# Patient Record
Sex: Female | Born: 1970 | Race: White | Hispanic: No | Smoking: Current every day smoker
Health system: Southern US, Community
[De-identification: ages and names within clinical notes are randomized; demographics above are authoritative.]

---

## 2016-06-26 ENCOUNTER — Emergency Department (HOSPITAL_COMMUNITY)
Admission: EM | Admit: 2016-06-26 | Discharge: 2016-06-26 | Disposition: A | Discharge status: Home / Self Care | Payer: Self-pay | Attending: Emergency Medicine | Admitting: Emergency Medicine

## 2016-06-26 ENCOUNTER — Encounter (HOSPITAL_COMMUNITY): Payer: Self-pay

## 2016-06-26 ENCOUNTER — Emergency Department (HOSPITAL_COMMUNITY): Payer: Self-pay

## 2016-06-26 DIAGNOSIS — R079 Chest pain, unspecified: Secondary | ICD-10-CM | POA: Insufficient documentation

## 2016-06-26 DIAGNOSIS — Z5321 Procedure and treatment not carried out due to patient leaving prior to being seen by health care provider: Secondary | ICD-10-CM | POA: Insufficient documentation

## 2016-06-26 LAB — BASIC METABOLIC PANEL
Anion gap: 8 (ref 5–15)
BUN: 7 mg/dL (ref 6–20)
CO2: 25 mmol/L (ref 22–32)
CREATININE: 0.79 mg/dL (ref 0.44–1.00)
Calcium: 9 mg/dL (ref 8.9–10.3)
Chloride: 105 mmol/L (ref 101–111)
GFR calc Af Amer: >60 mL/min (ref >60)
GLUCOSE: 111 mg/dL — AB (ref 65–99)
POTASSIUM: 3.6 mmol/L (ref 3.5–5.1)
SODIUM: 138 mmol/L (ref 135–145)

## 2016-06-26 LAB — CBC
HEMATOCRIT: 25.7 % — AB (ref 36.0–46.0)
Hemoglobin: 7 g/dL — ABNORMAL LOW (ref 12.0–15.0)
MCH: 17.9 pg — ABNORMAL LOW (ref 26.0–34.0)
MCHC: 27.2 g/dL — AB (ref 30.0–36.0)
MCV: 65.7 fL — AB (ref 78.0–100.0)
PLATELETS: 210 10*3/uL (ref 150–400)
RBC: 3.91 MIL/uL (ref 3.87–5.11)
RDW: 17.1 % — AB (ref 11.5–15.5)
WBC: 9.8 10*3/uL (ref 4.0–10.5)

## 2016-06-26 LAB — I-STAT TROPONIN, ED: Troponin i, poc: 0 ng/mL (ref 0.00–0.08)

## 2016-06-26 NOTE — ED Triage Notes (Signed)
Pt complaining of L sided chest pain and burning. Pt states new onset this evening. Pt also complaining of some SOB. Pt denies any cough. Pt states pain radiates to L jaw.

## 2016-06-26 NOTE — ED Triage Notes (Signed)
Pt. added itchy localized skin rashes at right forearm for several days .

## 2016-06-26 NOTE — ED Notes (Signed)
Warm blankets handed out and delay explanation provided to all patients in lobby.   

## 2016-06-26 NOTE — ED Notes (Signed)
Patient came up to RN at [facility] and stated "I feel sick and terrible so I am just going to go home".  This RN stated "that is exactly why you should stay.  Can I do anything?".  Pt expressed frustrations with wait time.  This RN reviewed patients chart and strongly encouraged patient not to leave.  Patient stated "there must not be anything wrong with me if you would let me sit out here this long".  This RN attempted to explain that wait times may still happen even if a patient is considered "sick" and that she continued to recommend patient stay due to labs.  Patient refused.  This RN asked if patient would wait for RN to get MD to review labs and POC with patient before getting her to a room, patient refused and left the ED.

## 2016-06-26 NOTE — ED Notes (Signed)
Pt also has a rash on her right arm with redness and itching.

## 2016-08-28 DIAGNOSIS — R632 Polyphagia: Secondary | ICD-10-CM | POA: Diagnosis not present

## 2016-09-10 DIAGNOSIS — D508 Other iron deficiency anemias: Secondary | ICD-10-CM | POA: Diagnosis not present

## 2016-09-10 DIAGNOSIS — N925 Other specified irregular menstruation: Secondary | ICD-10-CM | POA: Diagnosis not present

## 2016-09-10 DIAGNOSIS — Z01419 Encounter for gynecological examination (general) (routine) without abnormal findings: Secondary | ICD-10-CM | POA: Diagnosis not present

## 2016-09-10 DIAGNOSIS — Z0000 Encounter for general adult medical examination without abnormal findings: Secondary | ICD-10-CM | POA: Diagnosis not present

## 2016-09-25 DIAGNOSIS — D508 Other iron deficiency anemias: Secondary | ICD-10-CM | POA: Diagnosis not present

## 2016-12-09 DIAGNOSIS — D508 Other iron deficiency anemias: Secondary | ICD-10-CM | POA: Diagnosis not present

## 2016-12-09 DIAGNOSIS — N925 Other specified irregular menstruation: Secondary | ICD-10-CM | POA: Diagnosis not present

## 2016-12-22 DIAGNOSIS — N925 Other specified irregular menstruation: Secondary | ICD-10-CM | POA: Diagnosis not present

## 2016-12-23 DIAGNOSIS — N925 Other specified irregular menstruation: Secondary | ICD-10-CM | POA: Diagnosis not present

## 2017-07-01 DIAGNOSIS — D508 Other iron deficiency anemias: Secondary | ICD-10-CM | POA: Diagnosis not present

## 2018-08-02 IMAGING — CR DG CHEST 2V
2 series · 2 of 2 positions shown · non-contrast
Comparison: None.

CLINICAL DATA: Patient with numbness.  Chest pain and palpitations.

EXAM:
CHEST  2 VIEW

[chest pa]
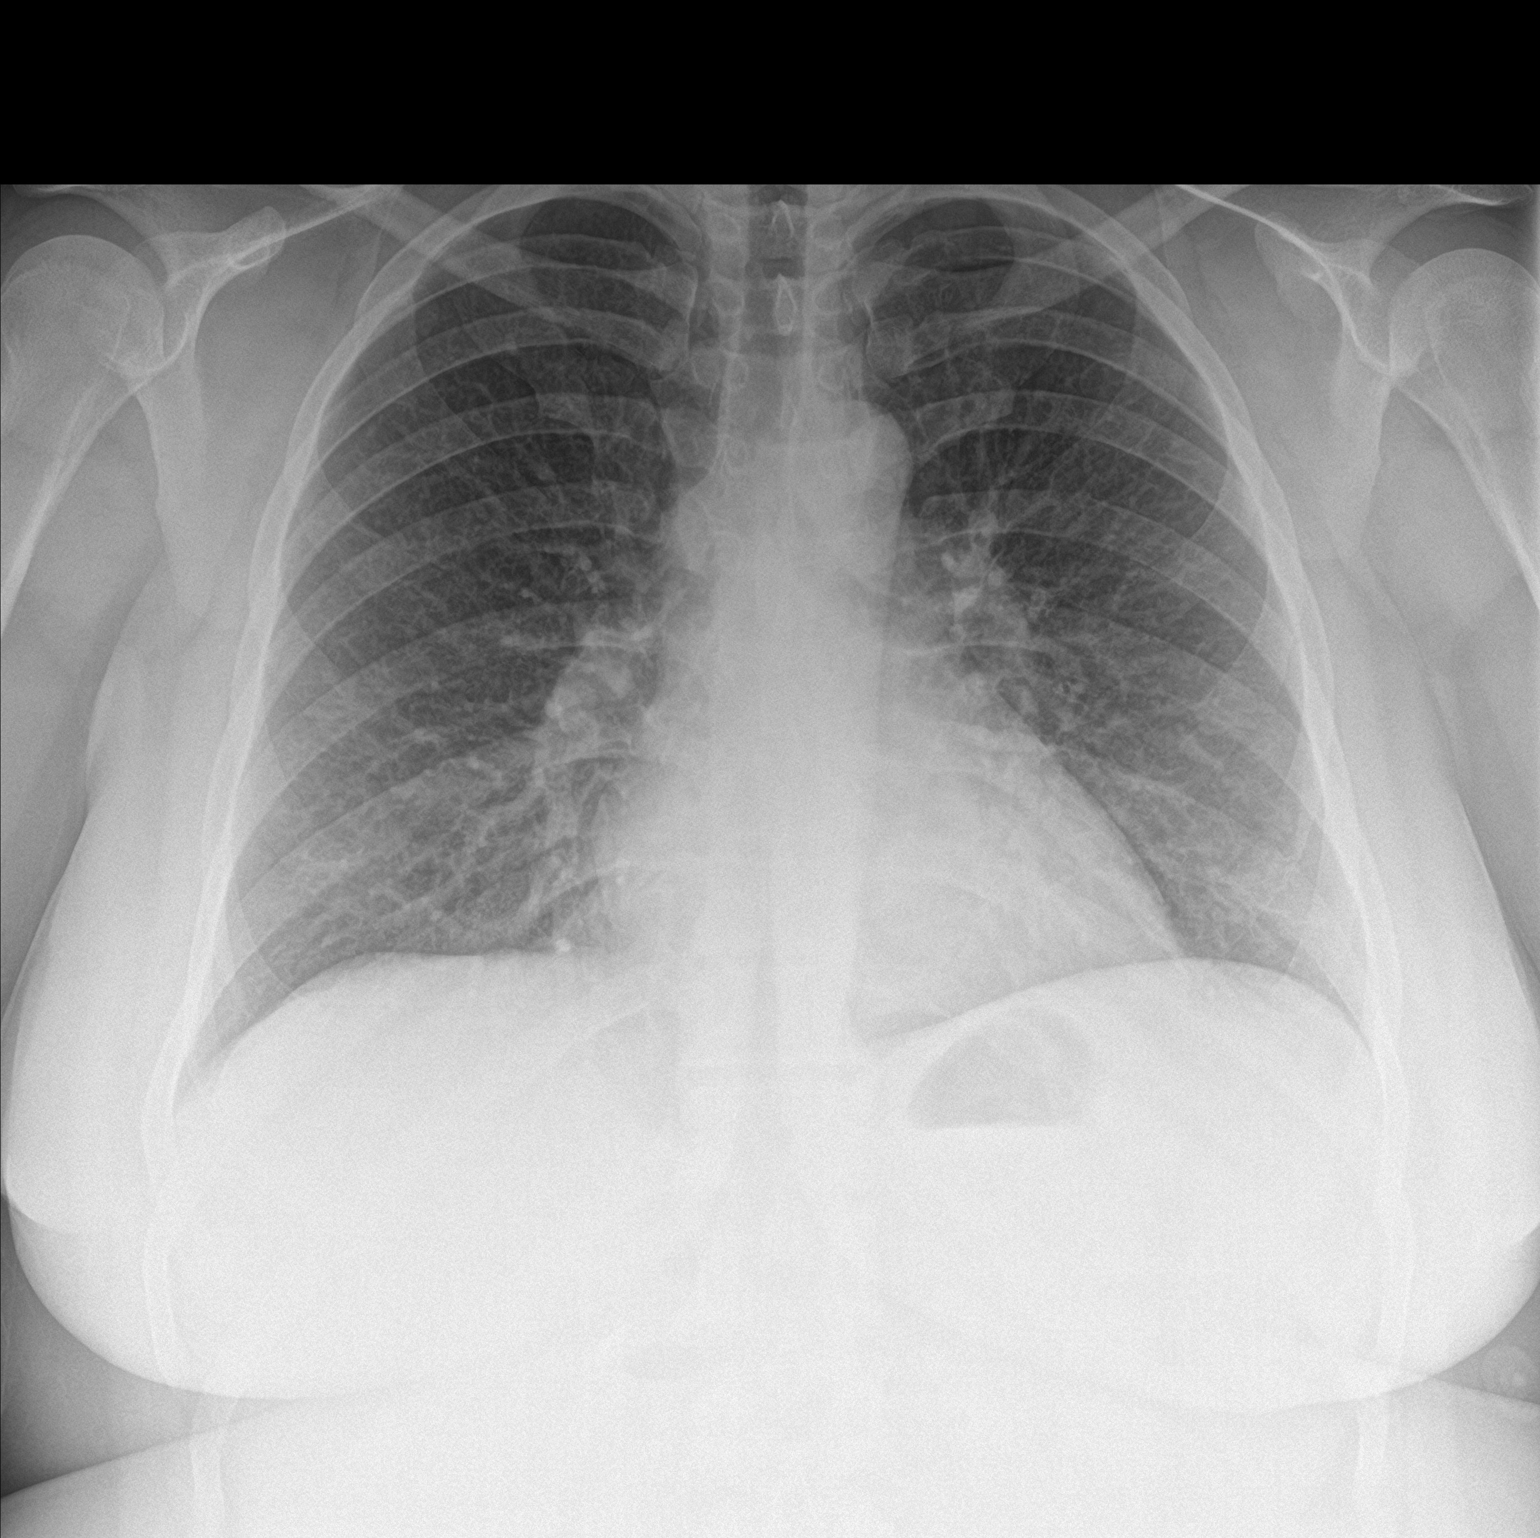

[chest lat]
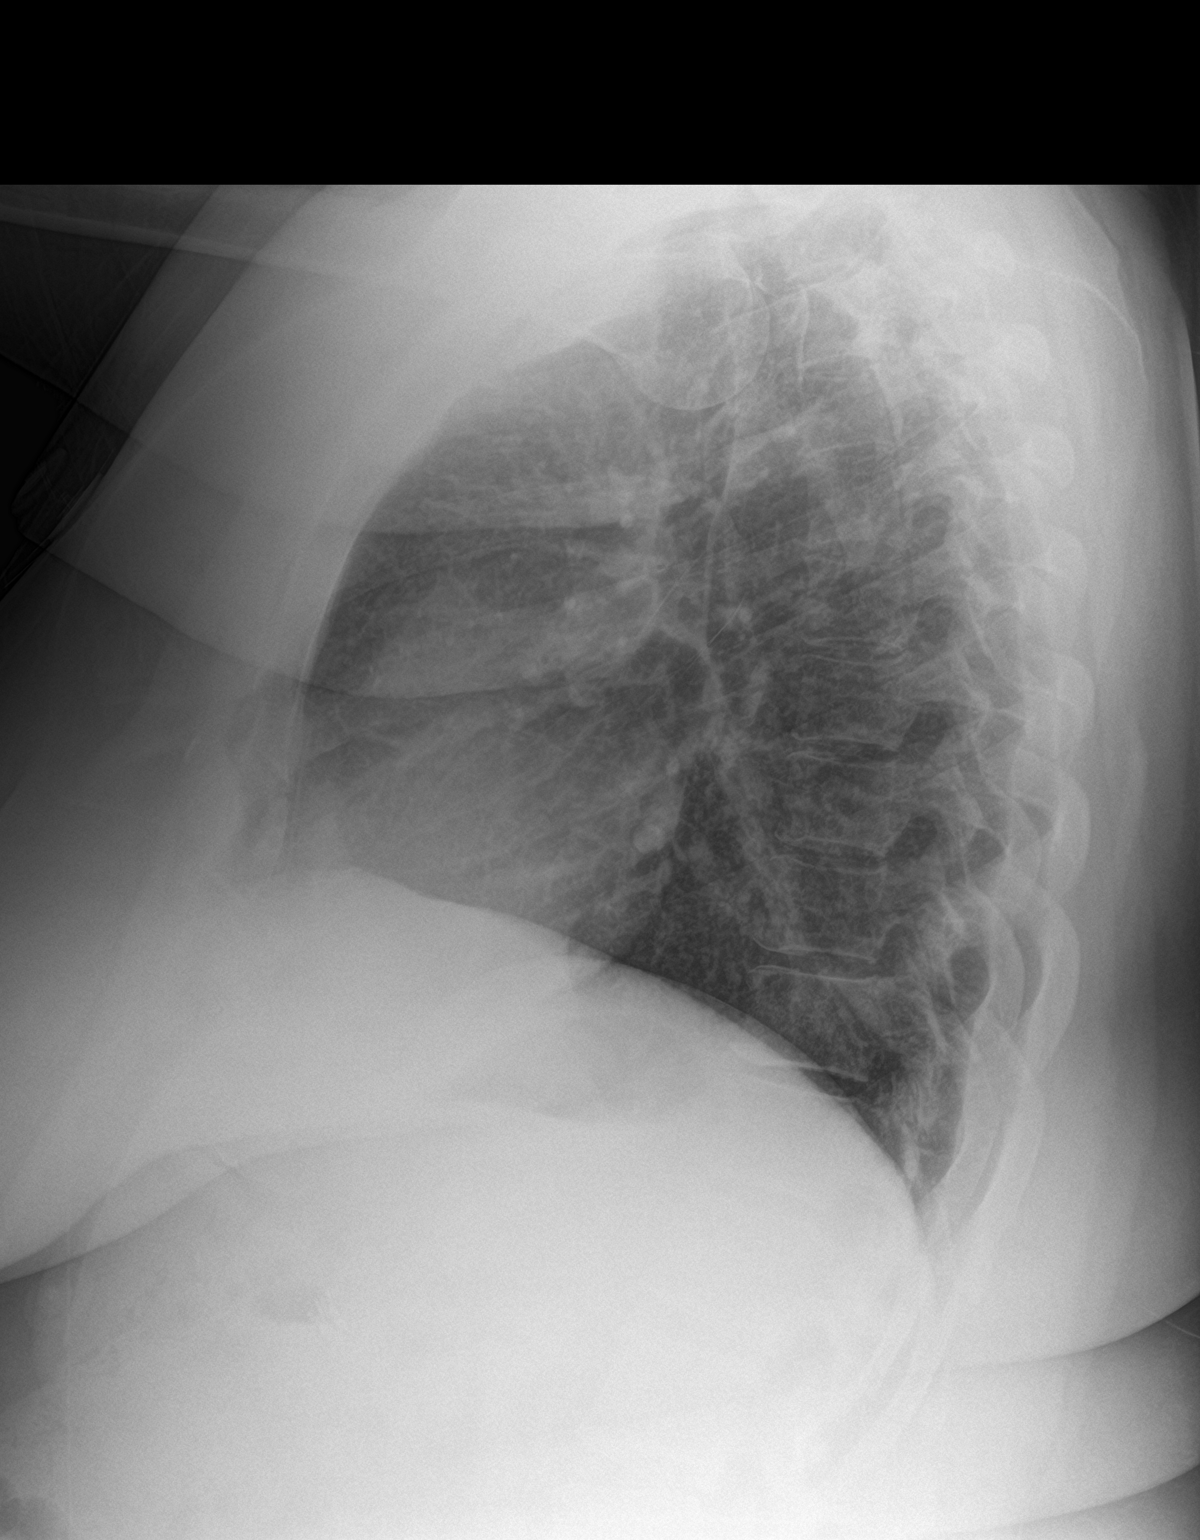

[2 of 2 positions shown; findings below may reference images not displayed]

FINDINGS: The heart size and mediastinal contours are within normal limits.
Both lungs are clear. The visualized skeletal structures are
unremarkable.
IMPRESSION: No active cardiopulmonary disease.
